# Patient Record
Sex: Female | Born: 1990 | Race: White | Hispanic: No | Marital: Single | Smoking: Never smoker
Health system: Southern US, Community
[De-identification: ages and names within clinical notes are randomized; demographics above are authoritative.]

## PROBLEM LIST (undated history)

## (undated) HISTORY — PX: WISDOM TOOTH EXTRACTION: SHX21

## (undated) HISTORY — PX: JOINT REPLACEMENT: SHX530

---

## 2017-12-29 DIAGNOSIS — Z880 Allergy status to penicillin: Secondary | ICD-10-CM | POA: Insufficient documentation

## 2017-12-29 DIAGNOSIS — Z9660 Presence of unspecified orthopedic joint implant: Secondary | ICD-10-CM | POA: Insufficient documentation

## 2017-12-29 DIAGNOSIS — Z8249 Family history of ischemic heart disease and other diseases of the circulatory system: Secondary | ICD-10-CM | POA: Insufficient documentation

## 2017-12-29 DIAGNOSIS — K3580 Unspecified acute appendicitis: Principal | ICD-10-CM | POA: Insufficient documentation

## 2017-12-29 DIAGNOSIS — R109 Unspecified abdominal pain: Secondary | ICD-10-CM | POA: Insufficient documentation

## 2017-12-29 LAB — COMPREHENSIVE METABOLIC PANEL
ALBUMIN: 4 g/dL (ref 3.5–5.0)
ALT: 18 U/L (ref 14–54)
ANION GAP: 8 (ref 5–15)
AST: 22 U/L (ref 15–41)
Alkaline Phosphatase: 48 U/L (ref 38–126)
BUN: 12 mg/dL (ref 6–20)
CO2: 22 mmol/L (ref 22–32)
Calcium: 8.6 mg/dL — ABNORMAL LOW (ref 8.9–10.3)
Chloride: 106 mmol/L (ref 101–111)
Creatinine, Ser: 0.98 mg/dL (ref 0.44–1.00)
GFR calc Af Amer: >60 mL/min (ref >60)
GFR calc non Af Amer: >60 mL/min (ref >60)
GLUCOSE: 126 mg/dL — AB (ref 65–99)
POTASSIUM: 3.4 mmol/L — AB (ref 3.5–5.1)
SODIUM: 136 mmol/L (ref 135–145)
Total Bilirubin: 0.8 mg/dL (ref 0.3–1.2)
Total Protein: 6.7 g/dL (ref 6.5–8.1)

## 2017-12-29 LAB — URINALYSIS, COMPLETE (UACMP) WITH MICROSCOPIC
BACTERIA UA: NONE SEEN
Bilirubin Urine: NEGATIVE
Glucose, UA: NEGATIVE mg/dL
HGB URINE DIPSTICK: NEGATIVE
KETONES UR: 5 mg/dL — AB
Leukocytes, UA: NEGATIVE
NITRITE: NEGATIVE
PROTEIN: NEGATIVE mg/dL
Specific Gravity, Urine: 1.016 (ref 1.005–1.030)
pH: 7 (ref 5.0–8.0)

## 2017-12-29 LAB — CBC
HEMATOCRIT: 36.9 % (ref 35.0–47.0)
HEMOGLOBIN: 12.5 g/dL (ref 12.0–16.0)
MCH: 31.5 pg (ref 26.0–34.0)
MCHC: 33.9 g/dL (ref 32.0–36.0)
MCV: 92.8 fL (ref 80.0–100.0)
Platelets: 251 10*3/uL (ref 150–440)
RBC: 3.97 MIL/uL (ref 3.80–5.20)
RDW: 12.4 % (ref 11.5–14.5)
WBC: 12.9 10*3/uL — ABNORMAL HIGH (ref 3.6–11.0)

## 2017-12-29 LAB — POCT PREGNANCY, URINE: PREG TEST UR: NEGATIVE

## 2017-12-29 LAB — LIPASE, BLOOD: LIPASE: 24 U/L (ref 11–51)

## 2017-12-29 NOTE — ED Triage Notes (Signed)
Patient c/o lower right abdominal pain described as twisting. Patient denies N/V, changes to urination, hx of ovarian cysts.

## 2017-12-29 NOTE — ED Notes (Signed)
Patient ambulatory to stat desk without difficulty or distress noted.  Patient reports having right lower quad abdominal pain.

## 2017-12-30 ENCOUNTER — Encounter
Admission: EM | Disposition: A | Discharge status: Home / Self Care | Payer: Self-pay | Source: Home / Self Care | Attending: Emergency Medicine

## 2017-12-30 ENCOUNTER — Emergency Department: Payer: Self-pay

## 2017-12-30 ENCOUNTER — Observation Stay: Payer: Self-pay | Admitting: Registered Nurse

## 2017-12-30 ENCOUNTER — Observation Stay
Admission: EM | Admit: 2017-12-30 | Discharge: 2017-12-31 | Disposition: A | Discharge status: Home / Self Care | Payer: Self-pay | Attending: General Surgery | Admitting: General Surgery

## 2017-12-30 ENCOUNTER — Other Ambulatory Visit: Payer: Self-pay

## 2017-12-30 DIAGNOSIS — K3580 Unspecified acute appendicitis: Principal | ICD-10-CM

## 2017-12-30 DIAGNOSIS — R1031 Right lower quadrant pain: Secondary | ICD-10-CM

## 2017-12-30 DIAGNOSIS — R109 Unspecified abdominal pain: Secondary | ICD-10-CM | POA: Diagnosis present

## 2017-12-30 HISTORY — PX: LAPAROSCOPIC APPENDECTOMY: SHX408

## 2017-12-30 LAB — BASIC METABOLIC PANEL
Anion gap: 10 (ref 5–15)
BUN: 8 mg/dL (ref 6–20)
CO2: 21 mmol/L — AB (ref 22–32)
CREATININE: 0.72 mg/dL (ref 0.44–1.00)
Calcium: 8.1 mg/dL — ABNORMAL LOW (ref 8.9–10.3)
Chloride: 106 mmol/L (ref 101–111)
GFR calc Af Amer: >60 mL/min (ref >60)
GFR calc non Af Amer: >60 mL/min (ref >60)
Glucose, Bld: 167 mg/dL — ABNORMAL HIGH (ref 65–99)
Potassium: 3.6 mmol/L (ref 3.5–5.1)
SODIUM: 137 mmol/L (ref 135–145)

## 2017-12-30 LAB — CBC
HCT: 34.8 % — ABNORMAL LOW (ref 35.0–47.0)
Hemoglobin: 11.8 g/dL — ABNORMAL LOW (ref 12.0–16.0)
MCH: 31.1 pg (ref 26.0–34.0)
MCHC: 33.8 g/dL (ref 32.0–36.0)
MCV: 91.9 fL (ref 80.0–100.0)
PLATELETS: 190 10*3/uL (ref 150–440)
RBC: 3.79 MIL/uL — ABNORMAL LOW (ref 3.80–5.20)
RDW: 12.4 % (ref 11.5–14.5)
WBC: 12.3 10*3/uL — ABNORMAL HIGH (ref 3.6–11.0)

## 2017-12-30 LAB — TYPE AND SCREEN
ABO/RH(D): A POS
ANTIBODY SCREEN: NEGATIVE

## 2017-12-30 SURGERY — APPENDECTOMY, LAPAROSCOPIC
Anesthesia: General

## 2017-12-30 MED ORDER — BUPIVACAINE-EPINEPHRINE (PF) 0.25% -1:200000 IJ SOLN
INTRAMUSCULAR | Status: DC | PRN
  Administered 2017-12-30: 10 mL

## 2017-12-30 MED ORDER — ONDANSETRON HCL 4 MG/2ML IJ SOLN
INTRAMUSCULAR | Status: AC
  Filled 2017-12-30: qty 2

## 2017-12-30 MED ORDER — ACETAMINOPHEN 10 MG/ML IV SOLN
1000.0000 mg | Freq: Four times a day (QID) | INTRAVENOUS | Status: AC | PRN
  Administered 2017-12-30 (×2): 1000 mg via INTRAVENOUS
  Filled 2017-12-30 (×3): qty 100

## 2017-12-30 MED ORDER — SUGAMMADEX SODIUM 200 MG/2ML IV SOLN
INTRAVENOUS | Status: AC
  Filled 2017-12-30: qty 2

## 2017-12-30 MED ORDER — DEXTROSE 5 % IV SOLN
2.0000 g | Freq: Once | INTRAVENOUS | Status: AC
  Administered 2017-12-30: 2 g via INTRAVENOUS
  Filled 2017-12-30 (×2): qty 2

## 2017-12-30 MED ORDER — ONDANSETRON HCL 4 MG/2ML IJ SOLN
4.0000 mg | Freq: Once | INTRAMUSCULAR | Status: AC
  Administered 2017-12-30: 4 mg via INTRAVENOUS
  Filled 2017-12-30: qty 2

## 2017-12-30 MED ORDER — PROMETHAZINE HCL 25 MG/ML IJ SOLN: 6.2500 mg | INTRAMUSCULAR | Status: DC | PRN

## 2017-12-30 MED ORDER — KETOROLAC TROMETHAMINE 30 MG/ML IJ SOLN
INTRAMUSCULAR | Status: AC
Start: 2017-12-30 — End: ?
  Filled 2017-12-30: qty 1

## 2017-12-30 MED ORDER — MORPHINE SULFATE (PF) 2 MG/ML IV SOLN
2.0000 mg | INTRAVENOUS | Status: DC | PRN
  Administered 2017-12-30 (×3): 2 mg via INTRAVENOUS
  Filled 2017-12-30 (×3): qty 1

## 2017-12-30 MED ORDER — IOPAMIDOL (ISOVUE-300) INJECTION 61%
100.0000 mL | Freq: Once | INTRAVENOUS | Status: AC | PRN
  Administered 2017-12-30: 100 mL via INTRAVENOUS

## 2017-12-30 MED ORDER — LIDOCAINE HCL (PF) 2 % IJ SOLN
INTRAMUSCULAR | Status: AC
  Filled 2017-12-30: qty 10

## 2017-12-30 MED ORDER — MIDAZOLAM HCL 2 MG/2ML IJ SOLN
INTRAMUSCULAR | Status: DC | PRN
  Administered 2017-12-30: 2 mg via INTRAVENOUS

## 2017-12-30 MED ORDER — MORPHINE SULFATE (PF) 4 MG/ML IV SOLN
4.0000 mg | Freq: Once | INTRAVENOUS | Status: AC
  Administered 2017-12-30: 4 mg via INTRAVENOUS
  Filled 2017-12-30: qty 1

## 2017-12-30 MED ORDER — LIDOCAINE HCL 1 % IJ SOLN
INTRAMUSCULAR | Status: DC | PRN
  Administered 2017-12-30: 10 mL

## 2017-12-30 MED ORDER — HYDROCODONE-ACETAMINOPHEN 5-325 MG PO TABS
1.0000 | ORAL_TABLET | Freq: Four times a day (QID) | ORAL | Status: DC | PRN
  Administered 2017-12-30 – 2017-12-31 (×2): 1 via ORAL
  Filled 2017-12-30: qty 2

## 2017-12-30 MED ORDER — SCOPOLAMINE 1 MG/3DAYS TD PT72
MEDICATED_PATCH | TRANSDERMAL | Status: AC
  Filled 2017-12-30: qty 1

## 2017-12-30 MED ORDER — ROCURONIUM BROMIDE 100 MG/10ML IV SOLN
INTRAVENOUS | Status: DC | PRN
  Administered 2017-12-30: 10 mg via INTRAVENOUS
  Administered 2017-12-30: 30 mg via INTRAVENOUS

## 2017-12-30 MED ORDER — DEXAMETHASONE SODIUM PHOSPHATE 10 MG/ML IJ SOLN
INTRAMUSCULAR | Status: DC | PRN
  Administered 2017-12-30: 5 mg via INTRAVENOUS

## 2017-12-30 MED ORDER — BUPIVACAINE-EPINEPHRINE (PF) 0.25% -1:200000 IJ SOLN
INTRAMUSCULAR | Status: AC
  Filled 2017-12-30: qty 30

## 2017-12-30 MED ORDER — DIPHENHYDRAMINE HCL 50 MG/ML IJ SOLN
25.0000 mg | Freq: Four times a day (QID) | INTRAMUSCULAR | Status: DC | PRN
Start: 2017-12-30 — End: 2017-12-31

## 2017-12-30 MED ORDER — FENTANYL CITRATE (PF) 100 MCG/2ML IJ SOLN
INTRAMUSCULAR | Status: AC
  Filled 2017-12-30: qty 2

## 2017-12-30 MED ORDER — CIPROFLOXACIN IN D5W 400 MG/200ML IV SOLN
400.0000 mg | Freq: Two times a day (BID) | INTRAVENOUS | Status: DC
  Administered 2017-12-30 – 2017-12-31 (×3): 400 mg via INTRAVENOUS
  Filled 2017-12-30 (×4): qty 200

## 2017-12-30 MED ORDER — MIDAZOLAM HCL 2 MG/2ML IJ SOLN
INTRAMUSCULAR | Status: AC
  Filled 2017-12-30: qty 2

## 2017-12-30 MED ORDER — LIDOCAINE HCL (PF) 1 % IJ SOLN
INTRAMUSCULAR | Status: AC
  Filled 2017-12-30: qty 30

## 2017-12-30 MED ORDER — SODIUM CHLORIDE 0.9 % IV BOLUS (SEPSIS)
1000.0000 mL | Freq: Once | INTRAVENOUS | Status: AC
  Administered 2017-12-30: 1000 mL via INTRAVENOUS

## 2017-12-30 MED ORDER — METRONIDAZOLE IN NACL 5-0.79 MG/ML-% IV SOLN
500.0000 mg | Freq: Once | INTRAVENOUS | Status: AC
  Administered 2017-12-30: 500 mg via INTRAVENOUS
  Filled 2017-12-30: qty 100

## 2017-12-30 MED ORDER — PROPOFOL 10 MG/ML IV BOLUS
INTRAVENOUS | Status: AC
  Filled 2017-12-30: qty 20

## 2017-12-30 MED ORDER — ONDANSETRON HCL 4 MG/2ML IJ SOLN
4.0000 mg | Freq: Four times a day (QID) | INTRAMUSCULAR | Status: DC | PRN
  Administered 2017-12-30 (×2): 4 mg via INTRAVENOUS
  Filled 2017-12-30 (×2): qty 2

## 2017-12-30 MED ORDER — HYDRALAZINE HCL 20 MG/ML IJ SOLN: 10.0000 mg | INTRAMUSCULAR | Status: DC | PRN

## 2017-12-30 MED ORDER — LACTATED RINGERS IV SOLN
INTRAVENOUS | Status: DC | PRN
  Administered 2017-12-30: 15:00:00 via INTRAVENOUS

## 2017-12-30 MED ORDER — PROPOFOL 10 MG/ML IV BOLUS
INTRAVENOUS | Status: DC | PRN
  Administered 2017-12-30: 150 mg via INTRAVENOUS

## 2017-12-30 MED ORDER — SUGAMMADEX SODIUM 200 MG/2ML IV SOLN
INTRAVENOUS | Status: DC | PRN
  Administered 2017-12-30: 150 mg via INTRAVENOUS

## 2017-12-30 MED ORDER — METRONIDAZOLE IN NACL 5-0.79 MG/ML-% IV SOLN
500.0000 mg | Freq: Three times a day (TID) | INTRAVENOUS | Status: DC
  Administered 2017-12-30 – 2017-12-31 (×4): 500 mg via INTRAVENOUS
  Filled 2017-12-30 (×5): qty 100

## 2017-12-30 MED ORDER — DEXAMETHASONE SODIUM PHOSPHATE 10 MG/ML IJ SOLN
INTRAMUSCULAR | Status: AC
  Filled 2017-12-30: qty 1

## 2017-12-30 MED ORDER — FENTANYL CITRATE (PF) 100 MCG/2ML IJ SOLN: 25.0000 ug | INTRAMUSCULAR | Status: DC | PRN

## 2017-12-30 MED ORDER — FENTANYL CITRATE (PF) 100 MCG/2ML IJ SOLN
INTRAMUSCULAR | Status: DC | PRN
  Administered 2017-12-30 (×2): 25 ug via INTRAVENOUS
  Administered 2017-12-30 (×3): 50 ug via INTRAVENOUS

## 2017-12-30 MED ORDER — KETOROLAC TROMETHAMINE 30 MG/ML IJ SOLN
INTRAMUSCULAR | Status: DC | PRN
  Administered 2017-12-30: 30 mg via INTRAVENOUS

## 2017-12-30 MED ORDER — DEXTROSE IN LACTATED RINGERS 5 % IV SOLN
INTRAVENOUS | Status: DC
  Administered 2017-12-30 – 2017-12-31 (×3): via INTRAVENOUS

## 2017-12-30 MED ORDER — DIPHENHYDRAMINE HCL 25 MG PO CAPS: 25.0000 mg | ORAL_CAPSULE | Freq: Four times a day (QID) | ORAL | Status: DC | PRN

## 2017-12-30 MED ORDER — ONDANSETRON 4 MG PO TBDP: 4.0000 mg | ORAL_TABLET | Freq: Four times a day (QID) | ORAL | Status: DC | PRN

## 2017-12-30 MED ORDER — LIDOCAINE HCL (CARDIAC) 20 MG/ML IV SOLN
INTRAVENOUS | Status: DC | PRN
  Administered 2017-12-30: 60 mg via INTRAVENOUS

## 2017-12-30 MED ORDER — ROCURONIUM BROMIDE 50 MG/5ML IV SOLN
INTRAVENOUS | Status: AC
  Filled 2017-12-30: qty 1

## 2017-12-30 MED ORDER — ONDANSETRON HCL 4 MG/2ML IJ SOLN
INTRAMUSCULAR | Status: DC | PRN
  Administered 2017-12-30: 4 mg via INTRAVENOUS

## 2017-12-30 SURGICAL SUPPLY — 44 items
ADHESIVE MASTISOL STRL (MISCELLANEOUS) ×3 IMPLANT
APPLIER CLIP 5 13 M/L LIGAMAX5 (MISCELLANEOUS)
BLADE SURG SZ11 CARB STEEL (BLADE) ×3 IMPLANT
BULB RESERV EVAC DRAIN JP 100C (MISCELLANEOUS) IMPLANT
CANISTER SUCT 1200ML W/VALVE (MISCELLANEOUS) ×3 IMPLANT
CHLORAPREP W/TINT 26ML (MISCELLANEOUS) ×3 IMPLANT
CLIP APPLIE 5 13 M/L LIGAMAX5 (MISCELLANEOUS) IMPLANT
CLOSURE WOUND 1/2 X4 (GAUZE/BANDAGES/DRESSINGS) ×1
CUTTER FLEX LINEAR 45M (STAPLE) ×3 IMPLANT
DRAIN CHANNEL JP 19F (MISCELLANEOUS) IMPLANT
DRSG TEGADERM 2-3/8X2-3/4 SM (GAUZE/BANDAGES/DRESSINGS) ×9 IMPLANT
DRSG TELFA 4X3 1S NADH ST (GAUZE/BANDAGES/DRESSINGS) ×3 IMPLANT
ELECT REM PT RETURN 9FT ADLT (ELECTROSURGICAL) ×3
ELECTRODE REM PT RTRN 9FT ADLT (ELECTROSURGICAL) ×1 IMPLANT
GLOVE BIO SURGEON STRL SZ7.5 (GLOVE) ×9 IMPLANT
GLOVE INDICATOR 8.0 STRL GRN (GLOVE) ×3 IMPLANT
GOWN STRL REUS W/ TWL LRG LVL3 (GOWN DISPOSABLE) ×2 IMPLANT
GOWN STRL REUS W/TWL LRG LVL3 (GOWN DISPOSABLE) ×4
IRRIGATION STRYKERFLOW (MISCELLANEOUS) ×1 IMPLANT
IRRIGATOR STRYKERFLOW (MISCELLANEOUS) ×3
IV NS 1000ML (IV SOLUTION) ×2
IV NS 1000ML BAXH (IV SOLUTION) ×1 IMPLANT
KIT TURNOVER KIT A (KITS) ×3 IMPLANT
LABEL OR SOLS (LABEL) ×3 IMPLANT
NEEDLE HYPO 25X1 1.5 SAFETY (NEEDLE) ×3 IMPLANT
NEEDLE VERESS 14GA 120MM (NEEDLE) ×3 IMPLANT
NS IRRIG 500ML POUR BTL (IV SOLUTION) ×3 IMPLANT
PACK LAP CHOLECYSTECTOMY (MISCELLANEOUS) ×3 IMPLANT
POUCH SPECIMEN RETRIEVAL 10MM (ENDOMECHANICALS) ×3 IMPLANT
RELOAD 45 VASCULAR/THIN (ENDOMECHANICALS) ×3 IMPLANT
RELOAD STAPLE TA45 3.5 REG BLU (ENDOMECHANICALS) ×3 IMPLANT
SCALPEL HARMONIC ACE (MISCELLANEOUS) ×3 IMPLANT
SLEEVE ENDOPATH XCEL 5M (ENDOMECHANICALS) ×3 IMPLANT
STRIP CLOSURE SKIN 1/2X4 (GAUZE/BANDAGES/DRESSINGS) ×2 IMPLANT
SUT MNCRL 4-0 (SUTURE) ×2
SUT MNCRL 4-0 27XMFL (SUTURE) ×1
SUT VIC AB 3-0 SH 27 (SUTURE) ×2
SUT VIC AB 3-0 SH 27X BRD (SUTURE) ×1 IMPLANT
SUT VICRYL 0 UR6 27IN ABS (SUTURE) IMPLANT
SUTURE MNCRL 4-0 27XMF (SUTURE) ×1 IMPLANT
TRAY FOLEY W/METER SILVER 16FR (SET/KITS/TRAYS/PACK) ×3 IMPLANT
TROCAR XCEL 12X100 BLDLESS (ENDOMECHANICALS) ×3 IMPLANT
TROCAR XCEL NON-BLD 5MMX100MML (ENDOMECHANICALS) ×3 IMPLANT
TUBING INSUFFLATION (TUBING) ×3 IMPLANT

## 2017-12-30 NOTE — ED Notes (Signed)
Pt transported to room 217. 

## 2017-12-30 NOTE — Anesthesia Post-op Follow-up Note (Signed)
Anesthesia QCDR form completed.        

## 2017-12-30 NOTE — ED Notes (Signed)
ED Provider at bedside. 

## 2017-12-30 NOTE — Transfer of Care (Signed)
Immediate Anesthesia Transfer of Care Note  Patient: Julie Odom  Procedure(s) Performed: APPENDECTOMY LAPAROSCOPIC (N/A )  Patient Location: PACU  Anesthesia Type:General  Level of Consciousness: awake, alert  and oriented  Airway & Oxygen Therapy: Patient Spontanous Breathing  Post-op Assessment: Report given to RN and Post -op Vital signs reviewed and stable  Post vital signs: Reviewed and stable  Last Vitals:  Vitals:   12/30/17 1238 12/30/17 1535  BP: 101/61 125/77  Pulse: 95 (!) 114  Resp: 18 18  Temp: 37 C 37.4 C  SpO2: 99% 100%    Last Pain:  Vitals:   12/30/17 1238  TempSrc: Oral  PainSc:       Patients Stated Pain Goal: 0 (12/30/17 1145)  Complications: No apparent anesthesia complications

## 2017-12-30 NOTE — Progress Notes (Signed)
15 minute call to floor. 

## 2017-12-30 NOTE — ED Notes (Signed)
admitting Provider at bedside. 

## 2017-12-30 NOTE — Anesthesia Preprocedure Evaluation (Signed)
Anesthesia Evaluation  Patient identified by MRN, date of birth, ID band Patient awake    Reviewed: Allergy & Precautions, H&P , NPO status , Patient's Chart, lab work & pertinent test results, reviewed documented beta blocker date and time   History of Anesthesia Complications Negative for: history of anesthetic complications  Airway Mallampati: II  TM Distance: >3 FB Neck ROM: full    Dental  (+) Teeth Intact, Dental Advidsory Given   Pulmonary neg pulmonary ROS,           Cardiovascular Exercise Tolerance: Good negative cardio ROS       Neuro/Psych negative neurological ROS  negative psych ROS   GI/Hepatic negative GI ROS, Neg liver ROS,   Endo/Other  negative endocrine ROS  Renal/GU negative Renal ROS  negative genitourinary   Musculoskeletal   Abdominal   Peds  Hematology negative hematology ROS (+)   Anesthesia Other Findings History reviewed. No pertinent past medical history.   Reproductive/Obstetrics negative OB ROS                             Anesthesia Physical Anesthesia Plan  ASA: I  Anesthesia Plan: General   Post-op Pain Management:    Induction: Intravenous  PONV Risk Score and Plan: 3 and Ondansetron and Dexamethasone  Airway Management Planned: Oral ETT  Additional Equipment:   Intra-op Plan:   Post-operative Plan: Extubation in OR  Informed Consent: I have reviewed the patients History and Physical, chart, labs and discussed the procedure including the risks, benefits and alternatives for the proposed anesthesia with the patient or authorized representative who has indicated his/her understanding and acceptance.   Dental Advisory Given  Plan Discussed with: Anesthesiologist, CRNA and Surgeon  Anesthesia Plan Comments:         Anesthesia Quick Evaluation

## 2017-12-30 NOTE — Progress Notes (Signed)
CC: Abdominal pain Subjective: Patient admitted overnight with abdominal pain and questionable appendicitis.  Patient reports she is not any better this morning.  Continues to feel rundown.  Objective: Vital signs in last 24 hours: Temp:  [98.4 F (36.9 C)-99 F (37.2 C)] 98.4 F (36.9 C) (02/02 0427) Pulse Rate:  [88-115] 105 (02/02 0427) Resp:  [16-20] 20 (02/02 0427) BP: (101-132)/(66-81) 101/66 (02/02 0427) SpO2:  [96 %-100 %] 96 % (02/02 0427) Weight:  [60.3 kg (133 lb)-61.2 kg (135 lb)] 61.2 kg (135 lb) (02/01 1950) Last BM Date: 12/29/17  Intake/Output from previous day: No intake/output data recorded. Intake/Output this shift: No intake/output data recorded.  Physical exam:  General: No acute distress Chest: Clear to auscultation  heart: Tachycardic Abdomen: Soft, tender to palpation in the right lower quadrant  Lab Results: CBC  Recent Labs    12/29/17 1952 12/30/17 0546  WBC 12.9* 12.3*  HGB 12.5 11.8*  HCT 36.9 34.8*  PLT 251 190   BMET Recent Labs    12/29/17 1952 12/30/17 0546  NA 136 137  K 3.4* 3.6  CL 106 106  CO2 22 21*  GLUCOSE 126* 167*  BUN 12 8  CREATININE 0.98 0.72  CALCIUM 8.6* 8.1*   PT/INR No results for input(s): LABPROT, INR in the last 72 hours. ABG No results for input(s): PHART, HCO3 in the last 72 hours.  Invalid input(s): PCO2, PO2  Studies/Results: Ct Abdomen Pelvis W Contrast  Result Date: 12/30/2017 CLINICAL DATA:  Right lower quadrant abdominal pain. EXAM: CT ABDOMEN AND PELVIS WITH CONTRAST TECHNIQUE: Multidetector CT imaging of the abdomen and pelvis was performed using the standard protocol following bolus administration of intravenous contrast. CONTRAST:  ISOVUE-300 IOPAMIDOL (ISOVUE-300) INJECTION 61% COMPARISON:  None. FINDINGS: Lower chest: No acute abnormality. Hepatobiliary: No focal liver abnormality is seen. No gallstones, gallbladder wall thickening, or biliary dilatation. Pancreas: Unremarkable. No  pancreatic ductal dilatation or surrounding inflammatory changes. Spleen: Normal in size without focal abnormality. Adrenals/Urinary Tract: Adrenal glands are unremarkable. Kidneys are normal, without renal calculi, focal lesion, or hydronephrosis. Bladder is unremarkable. Stomach/Bowel: The stomach is unremarkable. There is no evidence of bowel obstruction. The appendix is enlarged and fluid filled with probable appendicolith, concerning for appendicitis. Appendix: Location: Right lower quadrant. Diameter: 15 mm. Appendicolith: Yes. Mucosal hyper-enhancement: Yes. Extraluminal gas: No. Periappendiceal collection: No. Vascular/Lymphatic: No significant vascular findings are present. No enlarged abdominal or pelvic lymph nodes. Reproductive: Uterus and bilateral adnexa are unremarkable. Other: Mild amount of free fluid is noted posteriorly in the pelvis which may be physiologic or potentially related to possible appendicitis. Musculoskeletal: No acute or significant osseous findings. IMPRESSION: Findings consistent with acute appendicitis. No definite abscess formation is seen. Electronically Signed   By: Lupita Raider, M.D.   On: 12/30/2017 02:53    Anti-infectives: Anti-infectives (From admission, onward)   Start     Dose/Rate Route Frequency Ordered Stop   12/30/17 1130  metroNIDAZOLE (FLAGYL) IVPB 500 mg     500 mg 100 mL/hr over 60 Minutes Intravenous Every 8 hours 12/30/17 0431     12/30/17 0431  ciprofloxacin (CIPRO) IVPB 400 mg     400 mg 200 mL/hr over 60 Minutes Intravenous Every 12 hours 12/30/17 0431     12/30/17 0315  cefTRIAXone (ROCEPHIN) 2 g in dextrose 5 % 50 mL IVPB     2 g 100 mL/hr over 30 Minutes Intravenous  Once 12/30/17 0308 12/30/17 0818   12/30/17 0315  metroNIDAZOLE (FLAGYL) IVPB  500 mg     500 mg 100 mL/hr over 60 Minutes Intravenous  Once 12/30/17 0308 12/30/17 0424      Assessment/Plan:  27 year old female with abdominal pain.  Concerning for appendicitis.   Discussed with the patient that with her current findings that I would recommend going to the operating room for laparoscopic appendectomy.  The procedure was described in detail to include the risk, benefits, alternatives.  Patient voiced understanding and desires to proceed.  Discussed that should she miraculously improved between now and this afternoon that we might cancel her operation but for now would proceed with a laparoscopic appendectomy.  Patient voiced understanding and agrees with this plan.  Welma Mccombs T. Tonita CongWoodham, MD, [facility] HospitalFACS General Surgeon [facility]  Day [facility] [phone](7a-7p) [phone] Night [facility] [phone](7p-7a) [phone] 12/30/2017

## 2017-12-30 NOTE — ED Provider Notes (Signed)
[facility] Emergency Department Provider Note  ____________________________________________   First MD Initiated Contact with Patient 12/30/17 0154     (approximate)  I have reviewed the triage vital signs and the nursing notes.   HISTORY  Chief Complaint Abdominal Pain   HPI Julie Odom is a 27 y.o. female who self presents to the emergency department with 1 day of abdominal pain.  The pain began insidiously although has been slowly progressive and is now moderate to severe.  It began as periumbilical pain but is progressed on to her right lower quadrant.  Is worse with movement coughing or sneezing.  Somewhat improved with rest.  Her last menstrual period was 1 week ago.  It is associated with some nausea but no vomiting.  No diarrhea.  She has no history of abdominal surgeries.  History reviewed. No pertinent past medical history.  There are no active problems to display for this patient.   Past Surgical History:  Procedure Laterality Date  . JOINT REPLACEMENT    . WISDOM TOOTH EXTRACTION      Prior to Admission medications   Medication Sig Start Date End Date Taking? Authorizing Provider  Norgestimate-Ethinyl Estradiol Triphasic (TRI-LINYAH) 0.18/0.215/0.25 MG-35 MCG tablet Take 1 tablet by mouth daily. 09/26/16  Yes [provider]    Allergies Penicillins  No family history on file.  Social History Social History   Tobacco Use  . Smoking status: Never Smoker  . Smokeless tobacco: Never Used  Substance Use Topics  . Alcohol use: Yes  . Drug use: Not on file    Review of Systems Constitutional: No fever/chills Eyes: No visual changes. ENT: No sore throat. Cardiovascular: Denies chest pain. Respiratory: Denies shortness of breath. Gastrointestinal: Positive for abdominal pain.  Positive for nausea, no vomiting.  No diarrhea.  No constipation. Genitourinary: Negative for dysuria. Musculoskeletal: Negative for back  pain. Skin: Negative for rash. Neurological: Negative for headaches, focal weakness or numbness.   ____________________________________________   PHYSICAL EXAM:  VITAL SIGNS: ED Triage Vitals  Enc Vitals Group     BP 12/29/17 1945 132/80     Pulse Rate 12/29/17 1945 88     Resp 12/29/17 1945 18     Temp 12/29/17 1945 98.9 F (37.2 C)     Temp Source 12/29/17 1945 Oral     SpO2 12/29/17 1945 98 %     Weight 12/29/17 1947 133 lb (60.3 kg)     Height 12/29/17 1947 5\' 4"  (1.626 m)     Head Circumference --      Peak Flow --      Pain Score 12/29/17 1950 7     Pain Loc --      Pain Edu? --      Excl. in GC? --     Constitutional: Alert and oriented x4 appears quite uncomfortable grimacing in pain Eyes: PERRL EOMI. Head: Atraumatic. Nose: No congestion/rhinnorhea. Mouth/Throat: No trismus Neck: No stridor.   Cardiovascular: Tachycardic rate, regular rhythm. Grossly normal heart sounds.  Good peripheral circulation. Respiratory: Normal respiratory effort.  No retractions. Lungs CTAB and moving good air Gastrointestinal: Quite tender over McBurney's point and tender in the left lower quadrant with positive Rovsing's positive rebound positive guarding and positive local peritonitis Musculoskeletal: No lower extremity edema   Neurologic:  Normal speech and language. No gross focal neurologic deficits are appreciated. Skin:  Skin is warm, dry and intact. No rash noted. Psychiatric: Mood and affect are normal. Speech and behavior are  normal.    ____________________________________________   DIFFERENTIAL includes but not limited to  Appendicitis, diverticulitis, ovarian torsion, ovarian cyst, nephrolithiasis, pyelonephritis ____________________________________________   LABS (all labs ordered are listed, but only abnormal results are displayed)  Labs Reviewed  COMPREHENSIVE METABOLIC PANEL - Abnormal; Notable for the following components:      Result Value   Potassium  3.4 (*)    Glucose, Bld 126 (*)    Calcium 8.6 (*)    All other components within normal limits  CBC - Abnormal; Notable for the following components:   WBC 12.9 (*)    All other components within normal limits  URINALYSIS, COMPLETE (UACMP) WITH MICROSCOPIC - Abnormal; Notable for the following components:   Color, Urine YELLOW (*)    APPearance CLEAR (*)    Ketones, ur 5 (*)    Squamous Epithelial / LPF 0-5 (*)    All other components within normal limits  LIPASE, BLOOD  POC URINE PREG, ED  POCT PREGNANCY, URINE  TYPE AND SCREEN    Lab work reviewed by me shows elevated white count consistent with infection __________________________________________  EKG   ____________________________________________  RADIOLOGY  CT abdomen pelvis reviewed by me consistent with acute nonperforated appendicitis ____________________________________________   PROCEDURES  Procedure(s) performed: no  Procedures  Critical Care performed: no  Observation: no ____________________________________________   INITIAL IMPRESSION / ASSESSMENT AND PLAN / ED COURSE  Pertinent labs & imaging results that were available during my care of the patient were reviewed by me and considered in my medical decision making (see chart for details).  The patient arrives tachycardic, uncomfortable appearing, with significant right lower quadrant tenderness and local peritonitis.  High clinical suspicion for appendicitis.  Morphine Zofran fluids and n.p.o. and CT scan with IV contrast are pending.    ----------------------------------------- 3:10 AM on 12/30/2017 -----------------------------------------  I spoke with on-call general surgeon Dr. Tonita Cong who will kindly come evaluate the patient.  The patient's pain is improved and she declines further pain medication at this time.  Her reported history of penicillin allergy is unknown ceftriaxone and  Flagyl.  ____________________________________________   FINAL CLINICAL IMPRESSION(S) / ED DIAGNOSES  Final diagnoses:  Acute appendicitis, unspecified acute appendicitis type      NEW MEDICATIONS STARTED DURING THIS VISIT:  New Prescriptions   No medications on file     Note:  This document was prepared using Dragon voice recognition software and may include unintentional dictation errors.     Merrily Brittle, MD 12/30/17 [phone]

## 2017-12-30 NOTE — Progress Notes (Signed)
Po ginger ale given to patient.

## 2017-12-30 NOTE — Brief Op Note (Signed)
12/30/2017  3:25 PM  PATIENT:  Ernestine McmurrayMary Chojnowski  27 y.o. female  PRE-OPERATIVE DIAGNOSIS:  appendicitis  POST-OPERATIVE DIAGNOSIS:  same  PROCEDURE:  Procedure(s): APPENDECTOMY LAPAROSCOPIC (N/A)  SURGEON:  Surgeon(s) and Role:    * Ricarda FrameWoodham, Levante Simones, MD - Primary  PHYSICIAN ASSISTANT:   ASSISTANTS: none   ANESTHESIA:   general  EBL:  10 mL   BLOOD ADMINISTERED:none  DRAINS: none   LOCAL MEDICATIONS USED:  MARCAINE   , XYLOCAINE  and Amount: 20 ml  SPECIMEN:  Source of Specimen:  appendix  DISPOSITION OF SPECIMEN:  PATHOLOGY  COUNTS:  YES  TOURNIQUET:  * No tourniquets in log *  DICTATION: .Dragon Dictation  PLAN OF CARE: Admit for overnight observation  PATIENT DISPOSITION:  PACU - hemodynamically stable.   Delay start of Pharmacological VTE agent (>24hrs) due to surgical blood loss or risk of bleeding: no

## 2017-12-30 NOTE — Anesthesia Procedure Notes (Signed)
Procedure Name: Intubation Date/Time: 12/30/2017 2:28 PM Performed by: Hedda Slade, CRNA Pre-anesthesia Checklist: Patient identified, Patient being monitored, Timeout performed, Emergency Drugs available and Suction available Patient Re-evaluated:Patient Re-evaluated prior to induction Oxygen Delivery Method: Circle system utilized Preoxygenation: Pre-oxygenation with 100% oxygen Induction Type: IV induction Ventilation: Mask ventilation without difficulty Laryngoscope Size: Mac and 3 Grade View: Grade I Tube type: Oral Tube size: 7.0 mm Number of attempts: 1 Airway Equipment and Method: Stylet Placement Confirmation: ETT inserted through vocal cords under direct vision,  positive ETCO2 and breath sounds checked- equal and bilateral Secured at: 21 cm Tube secured with: Tape Dental Injury: Teeth and Oropharynx as per pre-operative assessment

## 2017-12-30 NOTE — H&P (Signed)
Patient ID: Julie Odom, female   DOB: Jan 25, 1991, 27 y.o.   MRN: [id number]  CC: Abdominal pain  HPI Julie McmurrayMary Fare is a 27 y.o. female presents emergency department with a 1 day history of abdominal pain.  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  The pain gradually localized to her right lower quadrant and worsened throughout the day prompting her to be seen in the emergency department.  Patient reports it is a sharp stabbing pain worsening with palpation or movement.  She is never had pain like this before.  She has had some nausea but denies any vomiting.  She denies any fevers, chills, chest pain, shortness of breath, diarrhea, constipation.  She was in her usual state of excellent health prior to this occurring.  HPI  History reviewed. No pertinent past medical history.  Patient has had an ACL repair but no abdominal surgeries.  Past Surgical History:  Procedure Laterality Date  . JOINT REPLACEMENT    . WISDOM TOOTH EXTRACTION      Family history: Patient with a father with cardiac disease but no known history of diabetes or cancers within the family.  Social History Social History   Tobacco Use  . Smoking status: Never Smoker  . Smokeless tobacco: Never Used  Substance Use Topics  . Alcohol use: Yes  . Drug use: Not on file    Allergies  Allergen Reactions  . Penicillins     Current Facility-Administered Medications  Medication Dose Route Frequency Provider Last Rate Last Dose  . cefTRIAXone (ROCEPHIN) 2 g in dextrose 5 % 50 mL IVPB  2 g Intravenous Once Merrily Brittleifenbark, Neil, MD       And  . metroNIDAZOLE (FLAGYL) IVPB 500 mg  500 mg Intravenous Once Merrily Brittleifenbark, Neil, MD 100 mL/hr at 12/30/17 0324 500 mg at 12/30/17 [id number]   Current Outpatient Medications  Medication Sig Dispense Refill  . Norgestimate-Ethinyl Estradiol Triphasic (TRI-LINYAH) 0.18/0.215/0.25 MG-35 MCG tablet Take 1 tablet by mouth  daily.       Review of Systems A multi-point review of systems was asked and was negative except for the findings documented in the HPI  Physical Exam Blood pressure 125/81, pulse (!) 107, temperature 99 F (37.2 C), temperature source Oral, resp. rate 16, height 5\' 4"  (1.626 m), weight 61.2 kg (135 lb), last menstrual period 12/22/2017, SpO2 100 %. CONSTITUTIONAL: Resting in bed no acute distress. EYES: Pupils are equal, round, and reactive to light, Sclera are non-icteric. EARS, NOSE, MOUTH AND THROAT: The oropharynx is clear. The oral mucosa is pink and moist. Hearing is intact to voice. LYMPH NODES:  Lymph nodes in the neck are normal. RESPIRATORY:  Lungs are clear. There is normal respiratory effort, with equal breath sounds bilaterally, and without pathologic use of accessory muscles. CARDIOVASCULAR: Heart is regular without murmurs, gallops, or rubs. GI: The abdomen is petite, soft, tender to palpation in the right lower quadrant near McBurney's point but without rebound or guarding, and nondistended. There are no palpable masses. There is no hepatosplenomegaly. There are normal bowel sounds in all quadrants. GU: Rectal deferred.   MUSCULOSKELETAL: Normal muscle strength and tone. No cyanosis or edema.   SKIN: Turgor is good and there are no pathologic skin lesions or ulcers. NEUROLOGIC: Motor and sensation is grossly normal. Cranial nerves are grossly intact. PSYCH:  Oriented to person, place and time. Affect is normal.  Data Reviewed Images and labs  reviewed.  Labs are significant for leukocytosis of 12.9 but the electrolytes are otherwise within normal limits.  CT scan of the abdomen does show evidence of inflammation in the right lower quadrant and the radiologist interprets it as appendicitis.  Difficult to differentiate between the appendix and small bowel in the right lower quadrant. I have personally reviewed the patient's imaging, laboratory findings and medical records.     Assessment    Abdominal pain    Plan    27 year old female with right lower quadrant abdominal pain.  With her history, physical, imaging findings discussed the probability of being appendicitis with the patient.  Discussed the treatment options of surgery for an appendectomy versus IV antibiotic therapy for appendicitis versus serial exams in case this is a diagnosis of appendicitis.  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  She elects observation with antibiotics and serial exams.  She understands should she fail to improve over the next 8-12 hours that she would again be offered surgery.  Should she worsen at any point despite therapy she will be offered surgery.  She understands the risks, benefits, alternatives.  Plan for observation, IV hydration, IV antibiotics, n.p.o.     Time spent with the patient was 50 minutes, with more than 50% of the time spent in face-to-face education, counseling and care coordination.     Ricarda Frame, MD FACS General Surgeon 12/30/2017, 3:49 AM

## 2017-12-30 NOTE — Op Note (Signed)
laparascopic appendectomy   Julie Odom Date of operation:  12/30/2017  Indications: The patient presented with a history of  abdominal pain. Workup has revealed findings consistent with acute appendicitis.  Pre-operative Diagnosis: Acute appendicitis without mention of peritonitis  Post-operative Diagnosis: Same  Surgeon: Leonette Mostharles T. Tonita CongWoodham, MD, FACS  Anesthesia: General with endotracheal tube  Procedure Details  The patient was seen again in the preop area. The options of surgery versus observation were reviewed with the patient and/or family. The risks of bleeding, infection, recurrence of symptoms, negative laparoscopy, potential for an open procedure, bowel injury, abscess or infection, were all reviewed as well. The patient was taken to Operating Room, identified as Julie Odom and the procedure verified as laparoscopic appendectomy. A Time Out was held and the above information confirmed.  The patient was placed in the supine position and general anesthesia was induced.  Antibiotic prophylaxis was administered and VT E prophylaxis was in place. A Foley catheter was placed by the nursing staff.   The abdomen was prepped and draped in a sterile fashion. An infraumbilical incision was made. A Veress needle was placed and pneumoperitoneum was obtained. A 5 mm trocar port was placed without difficulty and the abdominal cavity was explored.  Under direct vision a 5 mm suprapubic port was placed and a 12 mm left lateral port was placed all under direct vision.  The appendix was identified and found to be acutely inflamed in the pelvic position with inflammatory adhesions to the omentum, distal small bowel, right fallopian tube.  The appendix was carefully dissected. The base of the appendix was dissected out and divided with a standard load Endo GIA. The mesoappendix was divided the use of a harmonic scalpel.  The appendix was passed out through the left lateral port site with the aid of an  Endo Catch bag. The right lower quadrant and pelvis was then irrigated with copious amounts of normal saline which was aspirated. Inspection  failed to identify any additional bleeding and there were no signs of bowel injury.  At this point the pneumoperitoneum was released and all of the ports were removed.  The right lower quadrant fascia was closed with a figure-of-eight 3-0 Vicryl and all skin incisions were approximated with subcuticular 4-0 Monocryl. Steri-Strips and Mastisol and sterile dressings were placed.  The patient tolerated the procedure well, there were no complications. The sponge lap and needle count were correct at the end of the procedure.  The patient was taken to the recovery room in stable condition to be admitted for continued care.  Findings: Acute appendicitis  Estimated Blood Loss: 10 mL                  Specimens: appendix         Complications: None                  Julie Frameharles Caileigh Canche MD, FACS

## 2017-12-30 NOTE — ED Notes (Signed)
ED TO INPATIENT HANDOFF REPORT  Name/Age/Gender Julie Odom 27 y.o. female  Code Status Code Status History    This patient does not have a recorded code status. Please follow your organizational policy for patients in this situation.      Home/SNF/Other Home  Chief Complaint abd pain  Level of Care/Admitting Diagnosis ED Disposition    ED Disposition Condition [facility]: [place] [100120]  Level of Care: Med-Surg [16]  Diagnosis: Abdominal pain [144818]  Admitting Physician: Clayburn Pert [[id number]]  Attending Physician: Clayburn Pert [phone]  PT Class (Do Not Modify): Observation [104]  PT Acc Code (Do Not Modify): Observation [10022]       Medical History History reviewed. No pertinent past medical history.  Allergies Allergies  Allergen Reactions  . Penicillins     IV Location/Drains/Wounds Patient Lines/Drains/Airways Status   Active Line/Drains/Airways    Name:   Placement date:   Placement time:   Site:   Days:   Peripheral IV 12/30/17 Right Antecubital   12/30/17    0225    Antecubital   less than 1          Labs/Imaging Results for orders placed or performed during the hospital encounter of 12/30/17 (from the past 48 hour(s))  Lipase, blood     Status: None   Collection Time: 12/29/17  7:52 PM  Result Value Ref Range   Lipase 24 11 - 51 U/L    Comment: Performed at [facility], [address] [place], [place] [address]  Comprehensive metabolic panel     Status: Abnormal   Collection Time: 12/29/17  7:52 PM  Result Value Ref Range   Sodium 136 135 - 145 mmol/L   Potassium 3.4 (L) 3.5 - 5.1 mmol/L   Chloride 106 101 - 111 mmol/L   CO2 22 22 - 32 mmol/L   Glucose, Bld 126 (H) 65 - 99 mg/dL   BUN 12 6 - 20 mg/dL   Creatinine, Ser 0.98 0.44 - 1.00 mg/dL   Calcium 8.6 (L) 8.9 - 10.3 mg/dL   Total Protein 6.7 6.5 - 8.1 g/dL   Albumin 4.0 3.5 - 5.0 g/dL   AST 22 15 - 41 U/L   ALT  18 14 - 54 U/L   Alkaline Phosphatase 48 38 - 126 U/L   Total Bilirubin 0.8 0.3 - 1.2 mg/dL   GFR calc non Af Amer >60 >60 mL/min   GFR calc Af Amer >60 >60 mL/min    Comment: (NOTE) The eGFR has been calculated using the CKD EPI equation. This calculation has not been validated in all clinical situations. eGFR's persistently <60 mL/min signify possible Chronic Kidney Disease.    Anion gap 8 5 - 15    Comment: Performed at [facility], [address] [place], [place] [address]  CBC     Status: Abnormal   Collection Time: 12/29/17  7:52 PM  Result Value Ref Range   WBC 12.9 (H) 3.6 - 11.0 K/uL   RBC 3.97 3.80 - 5.20 MIL/uL   Hemoglobin 12.5 12.0 - 16.0 g/dL   HCT 36.9 35.0 - 47.0 %   MCV 92.8 80.0 - 100.0 fL   MCH 31.5 26.0 - 34.0 pg   MCHC 33.9 32.0 - 36.0 g/dL   RDW 12.4 11.5 - 14.5 %   Platelets 251 150 - 440 K/uL    Comment: Performed at [facility], [address], [place], [place] [address]  Urinalysis, Complete w Microscopic     Status: Abnormal   Collection Time: 12/29/17  7:52 PM  Result Value Ref Range   Color, Urine YELLOW (A) YELLOW   APPearance CLEAR (A) CLEAR   Specific Gravity, Urine 1.016 1.005 - 1.030   pH 7.0 5.0 - 8.0   Glucose, UA NEGATIVE NEGATIVE mg/dL   Hgb urine dipstick NEGATIVE NEGATIVE   Bilirubin Urine NEGATIVE NEGATIVE   Ketones, ur 5 (A) NEGATIVE mg/dL   Protein, ur NEGATIVE NEGATIVE mg/dL   Nitrite NEGATIVE NEGATIVE   Leukocytes, UA NEGATIVE NEGATIVE   RBC / HPF 0-5 0 - 5 RBC/hpf   WBC, UA 0-5 0 - 5 WBC/hpf   Bacteria, UA NONE SEEN NONE SEEN   Squamous Epithelial / LPF 0-5 (A) NONE SEEN   Mucus PRESENT     Comment: Performed at [facility], [address] [place], [place] [address]  Pregnancy, urine POC     Status: None   Collection Time: 12/29/17  8:06 PM  Result Value Ref Range   Preg Test, Ur NEGATIVE NEGATIVE    Comment:        THE SENSITIVITY OF THIS METHODOLOGY IS >24 mIU/mL    Ct Abdomen  Pelvis W Contrast  Result Date: 12/30/2017 CLINICAL DATA:  Right lower quadrant abdominal pain. EXAM: CT ABDOMEN AND PELVIS WITH CONTRAST TECHNIQUE: Multidetector CT imaging of the abdomen and pelvis was performed using the standard protocol following bolus administration of intravenous contrast. CONTRAST:  136m ISOVUE-300 IOPAMIDOL (ISOVUE-300) INJECTION 61% COMPARISON:  None. FINDINGS: Lower chest: No acute abnormality. Hepatobiliary: No focal liver abnormality is seen. No gallstones, gallbladder wall thickening, or biliary dilatation. Pancreas: Unremarkable. No pancreatic ductal dilatation or surrounding inflammatory changes. Spleen: Normal in size without focal abnormality. Adrenals/Urinary Tract: Adrenal glands are unremarkable. Kidneys are normal, without renal calculi, focal lesion, or hydronephrosis. Bladder is unremarkable. Stomach/Bowel: The stomach is unremarkable. There is no evidence of bowel obstruction. The appendix is enlarged and fluid filled with probable appendicolith, concerning for appendicitis. Appendix: Location: Right lower quadrant. Diameter: 15 mm. Appendicolith: Yes. Mucosal hyper-enhancement: Yes. Extraluminal gas: No. Periappendiceal collection: No. Vascular/Lymphatic: No significant vascular findings are present. No enlarged abdominal or pelvic lymph nodes. Reproductive: Uterus and bilateral adnexa are unremarkable. Other: Mild amount of free fluid is noted posteriorly in the pelvis which may be physiologic or potentially related to possible appendicitis. Musculoskeletal: No acute or significant osseous findings. IMPRESSION: Findings consistent with acute appendicitis. No definite abscess formation is seen. Electronically Signed   By: JMarijo Conception M.D.   On: 12/30/2017 02:53    Pending Labs Unresulted Labs (From admission, onward)   Start     Ordered   12/30/17 0309  [place] and screen [place],   STAT    Comments:  [place]   12/30/17 0308   Signed and Held  HIV antibody (Routine Testing)  Once,   R     Signed and Held   Signed and Held  Basic metabolic panel  Tomorrow morning,   R     Signed and Held   Signed and Held  CBC  Tomorrow morning,   R     Signed and Held      Vitals/Pain Today's Vitals   12/29/17 1950 12/30/17 0157 12/30/17 0250 12/30/17 0301  BP:  131/79  125/81  Pulse:  (!) 115  (!) 107  Resp:  16    Temp:  99 F (  37.2 C)    TempSrc:  Oral    SpO2:  98%  100%  Weight: 135 lb (61.2 kg)     Height: _0  (1.626 m)     PainSc: 7   6      Isolation Precautions No active isolations  Medications Medications  cefTRIAXone (ROCEPHIN) 2 g in dextrose 5 % 50 mL IVPB (not administered)    And  metroNIDAZOLE (FLAGYL) IVPB 500 mg (500 mg Intravenous New Bag/Given 12/30/17 0324)  sodium chloride 0.9 % bolus 1,000 mL (0 mLs Intravenous Stopped 12/30/17 0343)  ondansetron (ZOFRAN) injection 4 mg (4 mg Intravenous Given 12/30/17 0213)  morphine 4 MG/ML injection 4 mg (4 mg Intravenous Given 12/30/17 0213)  iopamidol (ISOVUE-300) 61 % injection 100 mL (100 mLs Intravenous Contrast Given 12/30/17 0227)  sodium chloride 0.9 % bolus 1,000 mL (1,000 mLs Intravenous New Bag/Given 12/30/17 0343)    Mobility walks

## 2017-12-30 NOTE — Anesthesia Postprocedure Evaluation (Signed)
Anesthesia Post Note  Patient: Ernestine McmurrayMary Alvis  Procedure(s) Performed: APPENDECTOMY LAPAROSCOPIC (N/A )  Patient location during evaluation: PACU Anesthesia Type: General Level of consciousness: awake and alert Pain management: pain level controlled Vital Signs Assessment: post-procedure vital signs reviewed and stable Respiratory status: spontaneous breathing, nonlabored ventilation, respiratory function stable and patient connected to nasal cannula oxygen Cardiovascular status: blood pressure returned to baseline and stable Postop Assessment: no apparent nausea or vomiting Anesthetic complications: no     Last Vitals:  Vitals:   12/30/17 1716 12/30/17 2024  BP: 116/68 (!) 102/56  Pulse: 92 89  Resp: 16 16  Temp: 36.8 C 36.7 C  SpO2: 99% 99%    Last Pain:  Vitals:   12/30/17 2024  TempSrc: Oral  PainSc:                  Lenard SimmerAndrew Andri Prestia

## 2017-12-31 ENCOUNTER — Encounter: Payer: Self-pay | Admitting: General Surgery

## 2017-12-31 ENCOUNTER — Other Ambulatory Visit: Payer: Self-pay

## 2017-12-31 MED ORDER — HYDROCODONE-ACETAMINOPHEN 5-325 MG PO TABS: 1.0000 | ORAL_TABLET | Freq: Four times a day (QID) | ORAL | 0 refills | Status: DC | PRN

## 2017-12-31 MED ORDER — METRONIDAZOLE 500 MG PO TABS: 500.0000 mg | ORAL_TABLET | Freq: Three times a day (TID) | ORAL | 1 refills | Status: DC

## 2017-12-31 MED ORDER — CIPROFLOXACIN HCL 500 MG PO TABS: 500.0000 mg | ORAL_TABLET | Freq: Two times a day (BID) | ORAL | 1 refills | Status: DC

## 2017-12-31 NOTE — Progress Notes (Signed)
Julie McmurrayMary Odom to be D/C'd Home per MD order.  Discussed prescriptions and follow up appointments with the Odom. Prescriptions given to Odom, medication list explained in detail. Pt verbalized understanding.  Allergies as of 12/31/2017      Reactions   Penicillins       Medication List    TAKE these medications   ciprofloxacin 500 MG tablet Commonly known as:  CIPRO Take 1 tablet (500 mg total) by mouth 2 (two) times daily.   HYDROcodone-acetaminophen 5-325 MG tablet Commonly known as:  NORCO/VICODIN Take 1-2 tablets by mouth every 6 (six) hours as needed for moderate pain or severe pain.   metroNIDAZOLE 500 MG tablet Commonly known as:  FLAGYL Take 1 tablet (500 mg total) by mouth 3 (three) times daily.   TRI-LINYAH 0.18/0.215/0.25 MG-35 MCG tablet Generic drug:  Norgestimate-Ethinyl Estradiol Triphasic Take 1 tablet by mouth daily.       Vitals:   12/30/17 2024 12/31/17 0459  BP: (!) 102/56 107/61  Pulse: 89 64  Resp: 16 18  Temp: 98 F (36.7 C) 97.8 F (36.6 C)  SpO2: 99% 100%    Skin clean, dry and intact without evidence of skin break down, no evidence of skin tears noted. IV catheter discontinued intact. Site without signs and symptoms of complications. Dressing and pressure applied. Pt denies pain at this time. No complaints noted.  An After Visit Summary was printed and given to the Odom.  Julie PatientJessica K Cybill Odom

## 2017-12-31 NOTE — Discharge Summary (Signed)
Physician Discharge Summary  Patient ID: Julie McmurrayMary Odom MRN: [id number] DOB/AGE: 27/20/1992 26 y.o.  Admit date: 12/30/2017 Discharge date: 12/31/2017   Discharge Diagnoses:  Active Problems:   Abdominal pain   Acute appendicitis   Procedures: Laparoscopic appendectomy  Hospital Course: This patient who presented to the hospital with signs of acute appendicitis.  She was taken the operating room where laparoscopic appendectomy was performed.  She had signs of severe acute appendicitis.  She was continued on IV antibiotics and is transition to oral antibiotics.  She will be discharged on oral Cipro and Flagyl and oral analgesics to follow-up with Dr. Armond HangWoodrum in 10 days.  She is instructed to remove dressings this afternoon and shower this afternoon.  She is on a regular diet.  Consults: None  Disposition: Final discharge disposition not confirmed   Allergies as of 12/31/2017      Reactions   Penicillins       Medication List    TAKE these medications   ciprofloxacin 500 MG tablet Commonly known as:  CIPRO Take 1 tablet (500 mg total) by mouth 2 (two) times daily.   HYDROcodone-acetaminophen 5-325 MG tablet Commonly known as:  NORCO/VICODIN Take 1-2 tablets by mouth every 6 (six) hours as needed for moderate pain or severe pain.   metroNIDAZOLE 500 MG tablet Commonly known as:  FLAGYL Take 1 tablet (500 mg total) by mouth 3 (three) times daily.   TRI-LINYAH 0.18/0.215/0.25 MG-35 MCG tablet Generic drug:  Norgestimate-Ethinyl Estradiol Triphasic Take 1 tablet by mouth daily.        Lattie Hawichard E Jule Schlabach, MD, FACS

## 2017-12-31 NOTE — Discharge Instructions (Signed)
Remove dressings this afternoon May shower this afternoon Resume home medications Start taking oral antibiotics and oral analgesics today. Follow-up with Dr. Tonita CongWoodham in 10 days

## 2017-12-31 NOTE — Progress Notes (Signed)
1 Day Post-Op  Subjective: Status post laparoscopic appendectomy for severe acute appendicitis.  Patient feels well this morning and is tolerating a diet and is ready for discharge.  Objective: Vital signs in last 24 hours: Temp:  [97.8 F (36.6 C)-99.4 F (37.4 C)] 97.8 F (36.6 C) (02/03 0459) Pulse Rate:  [64-114] 64 (02/03 0459) Resp:  [14-18] 18 (02/03 0459) BP: (101-125)/(56-77) 107/61 (02/03 0459) SpO2:  [95 %-100 %] 100 % (02/03 0459) Last BM Date: 12/29/17  Intake/Output from previous day: 02/02 0701 - 02/03 0700 In: 3555.8 [P.O.:237; I.V.:2918.8; IV Piggyback:400] Out: 60 [Urine:50; Blood:10] Intake/Output this shift: No intake/output data recorded.  Physical exam:  Wounds are clean no erythema or drainage abdomen is soft nondistended nontympanitic and nontender  Lab Results: CBC  Recent Labs    12/29/17 1952 12/30/17 0546  WBC 12.9* 12.3*  HGB 12.5 11.8*  HCT 36.9 34.8*  PLT 251 190   BMET Recent Labs    12/29/17 1952 12/30/17 0546  NA 136 137  K 3.4* 3.6  CL 106 106  CO2 22 21*  GLUCOSE 126* 167*  BUN 12 8  CREATININE 0.98 0.72  CALCIUM 8.6* 8.1*   PT/INR No results for input(s): LABPROT, INR in the last 72 hours. ABG No results for input(s): PHART, HCO3 in the last 72 hours.  Invalid input(s): PCO2, PO2  Studies/Results: Ct Abdomen Pelvis W Contrast  Result Date: 12/30/2017 CLINICAL DATA:  Right lower quadrant abdominal pain. EXAM: CT ABDOMEN AND PELVIS WITH CONTRAST TECHNIQUE: Multidetector CT imaging of the abdomen and pelvis was performed using the standard protocol following bolus administration of intravenous contrast. CONTRAST:  ISOVUE-300 IOPAMIDOL (ISOVUE-300) INJECTION 61% COMPARISON:  None. FINDINGS: Lower chest: No acute abnormality. Hepatobiliary: No focal liver abnormality is seen. No gallstones, gallbladder wall thickening, or biliary dilatation. Pancreas: Unremarkable. No pancreatic ductal dilatation or surrounding  inflammatory changes. Spleen: Normal in size without focal abnormality. Adrenals/Urinary Tract: Adrenal glands are unremarkable. Kidneys are normal, without renal calculi, focal lesion, or hydronephrosis. Bladder is unremarkable. Stomach/Bowel: The stomach is unremarkable. There is no evidence of bowel obstruction. The appendix is enlarged and fluid filled with probable appendicolith, concerning for appendicitis. Appendix: Location: Right lower quadrant. Diameter: 15 mm. Appendicolith: Yes. Mucosal hyper-enhancement: Yes. Extraluminal gas: No. Periappendiceal collection: No. Vascular/Lymphatic: No significant vascular findings are present. No enlarged abdominal or pelvic lymph nodes. Reproductive: Uterus and bilateral adnexa are unremarkable. Other: Mild amount of free fluid is noted posteriorly in the pelvis which may be physiologic or potentially related to possible appendicitis. Musculoskeletal: No acute or significant osseous findings. IMPRESSION: Findings consistent with acute appendicitis. No definite abscess formation is seen. Electronically Signed   By: Lupita Raider, M.D.   On: 12/30/2017 02:53    Anti-infectives: Anti-infectives (From admission, onward)   Start     Dose/Rate Route Frequency Ordered Stop   12/30/17 1130  metroNIDAZOLE (FLAGYL) IVPB 500 mg     500 mg 100 mL/hr over 60 Minutes Intravenous Every 8 hours 12/30/17 0431     12/30/17 0431  ciprofloxacin (CIPRO) IVPB 400 mg     400 mg 200 mL/hr over 60 Minutes Intravenous Every 12 hours 12/30/17 0431     12/30/17 0315  cefTRIAXone (ROCEPHIN) 2 g in dextrose 5 % 50 mL IVPB     2 g 100 mL/hr over 30 Minutes Intravenous  Once 12/30/17 0308 12/30/17 0818   12/30/17 0315  metroNIDAZOLE (FLAGYL) IVPB 500 mg     500 mg 100 mL/hr  over 60 Minutes Intravenous  Once 12/30/17 0308 12/30/17 0424      Assessment/Plan: s/p Procedure(s): APPENDECTOMY LAPAROSCOPIC   Patient doing very well.  Will discharge today on oral antibiotics to  follow-up with Dr. Armond HangWoodrum in 10 days.  She may shower  Lattie Hawichard E Brion Hedges, MD, Carilyn GoodpastureFACS  12/31/2017

## 2018-01-01 ENCOUNTER — Telehealth: Payer: Self-pay | Admitting: General Surgery

## 2018-01-01 LAB — HIV ANTIBODY (ROUTINE TESTING W REFLEX): HIV Screen 4th Generation wRfx: NONREACTIVE

## 2018-01-01 NOTE — Telephone Encounter (Signed)
Returned call at this time and advise patient that her restrictions would be no heavy lifting greater than 10 pounds, avoid strenuous activity as much as possible, no submerging in bathtubs, pool, and or hot tubs, be cautious of what she eats, and that if she does not have a bowel movement within the next 24 hours to try some over the counter Miralax. She verbalized understanding and wanted to know if she could move her post op appointment to be seen sooner due to wanting to return to clinic classes. I offered her an appointment to be seen on 2/13 with Dr. Aleen CampiPiscoya she verbalized understanding and accpected the appointment. I advised for her to call back with any questions or concerns she verbalized understanding.

## 2018-01-01 NOTE — Telephone Encounter (Signed)
Julie Odom called said Dr. Tonita CongWoodham went over her restrictions, but the Julie Odom can't remember what all they were and is asking if someone could give her a call reminding her of what she needs to do. Please call and advise.

## 2018-01-02 LAB — SURGICAL PATHOLOGY

## 2018-01-10 ENCOUNTER — Encounter: Payer: Self-pay | Admitting: Surgery

## 2018-01-10 ENCOUNTER — Ambulatory Visit (INDEPENDENT_AMBULATORY_CARE_PROVIDER_SITE_OTHER): Payer: Self-pay | Admitting: Surgery

## 2018-01-10 VITALS — BP 123/85 | HR 82 | Ht 64.0 in | Wt 131.8 lb

## 2018-01-10 DIAGNOSIS — Z09 Encounter for follow-up examination after completed treatment for conditions other than malignant neoplasm: Secondary | ICD-10-CM

## 2018-01-10 NOTE — Progress Notes (Signed)
01/10/2018  HPI: Patient is s/p laparoscopic appendectomy with Dr. Tonita CongWoodham on 12/30/17.  She presents today for follow up.  She reports no further pain and no issues with the incisions.  Having good appetite and normal bowel movements.  Denies any fevers or chills.  Vital signs: BP 123/85   Pulse 82   Ht 5\' 4"  (1.626 m)   Wt 59.8 kg (131 lb 12.8 oz)   LMP 12/22/2017 Comment: neg. preg test  BMI 22.62 kg/m    Physical Exam: Constitutional: No acute distress Abdomen:  Soft, nondistended, nontender to palpation.  Incisions are clean, dry, intact, and healing well, with no evidence of infection.  The left lower quadrant incision has what looks like surgical suture knot that is palpable, but still under the skin.  Assessment/Plan: 27 yo female s/p laparoscopic appendectomy  --discussed with patient no heavy lifting or pushing restriction until 01/27/18.  She may resume normal activities at that point. --reviewed pathology with patient.  Acute appendicitis without evidence of malignancy. --patient may follow up with us on an as needed basis.   Howie IllJose Luis Salvadore Valvano, MD [facility]

## 2018-01-10 NOTE — Patient Instructions (Signed)
GENERAL POST-OPERATIVE PATIENT INSTRUCTIONS   WOUND CARE INSTRUCTIONS:  Keep a dry clean dressing on the wound if there is drainage. The initial bandage may be removed after 24 hours.  Once the wound has quit draining you may leave it open to air.  If clothing rubs against the wound or causes irritation and the wound is not draining you may cover it with a dry dressing during the daytime.  Try to keep the wound dry and avoid ointments on the wound unless directed to do so.  If the wound becomes bright red and painful or starts to drain infected material that is not clear, please contact your physician immediately.  If the wound is mildly pink and has a thick firm ridge underneath it, this is normal, and is referred to as a healing ridge.  This will resolve over the next 4-6 weeks.  BATHING: You may shower if you have been informed of this by your surgeon. However, Please do not submerge in a tub, hot tub, or pool until incisions are completely sealed or have been told by your surgeon that you may do so.  DIET:  You may eat any foods that you can tolerate.  It is a good idea to eat a high fiber diet and take in plenty of fluids to prevent constipation.  If you do become constipated you may want to take a mild laxative or take ducolax tablets on a daily basis until your bowel habits are regular.  Constipation can be very uncomfortable, along with straining, after recent surgery.  ACTIVITY:  You are encouraged to cough and deep breath or use your incentive spirometer if you were given one, every 15-30 minutes when awake.  This will help prevent respiratory complications and low grade fevers post-operatively if you had a general anesthetic.  You may want to hug a pillow when coughing and sneezing to add additional support to the surgical area, if you had abdominal or chest surgery, which will decrease pain during these times.  You are encouraged to walk and engage in light activity for the next two weeks.  You  should not lift more than 20 pounds, until 01/27/2018 as it could put you at increased risk for complications.  Twenty pounds is roughly equivalent to a plastic bag of groceries. At that time- Listen to your body when lifting, if you have pain when lifting, stop and then try again in a few days. Soreness after doing exercises or activities of daily living is normal as you get back in to your normal routine.  MEDICATIONS:  Try to take narcotic medications and anti-inflammatory medications, such as tylenol, ibuprofen, naprosyn, etc., with food.  This will minimize stomach upset from the medication.  Should you develop nausea and vomiting from the pain medication, or develop a rash, please discontinue the medication and contact your physician.  You should not drive, make important decisions, or operate machinery when taking narcotic pain medication.  SUNBLOCK Use sun block to incision area over the next year if this area will be exposed to sun. This helps decrease scarring and will allow you avoid a permanent darkened area over your incision.  QUESTIONS:  Please feel free to call our office if you have any questions, and we will be glad to assist you. [phone]    

## 2018-01-17 ENCOUNTER — Encounter: Payer: Self-pay | Admitting: General Surgery

## 2019-04-27 IMAGING — CT CT ABD-PELV W/ CM
2 of 4 series · 15 of 46 positions shown, 17 images · IV contrast (APPLIED)
Comparison: None.

CLINICAL DATA: Right lower quadrant abdominal pain.

EXAM:
CT ABDOMEN AND PELVIS WITH CONTRAST
TECHNIQUE: Multidetector CT imaging of the abdomen and pelvis was performed
using the standard protocol following bolus administration of
intravenous contrast.
CONTRAST:  100mL LYOY57-588 IOPAMIDOL (LYOY57-588) INJECTION 61%

[Series 2: routine abd/pel with · axial · 0.60mm/px · z∈[-1122,-758]mm · 12 of 87 slices shown, 14 images]
[im 7/87  soft-tissue]
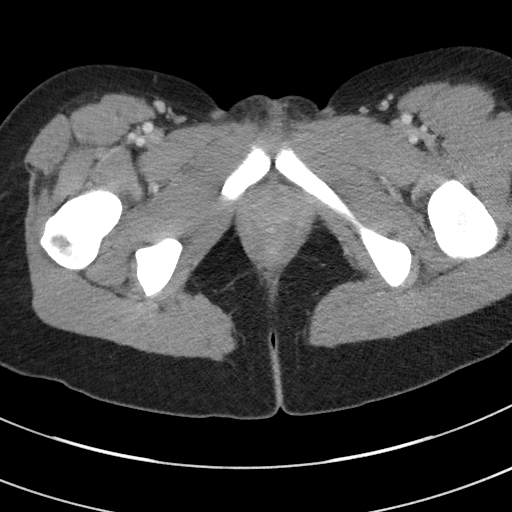
[im 7/87  bone]
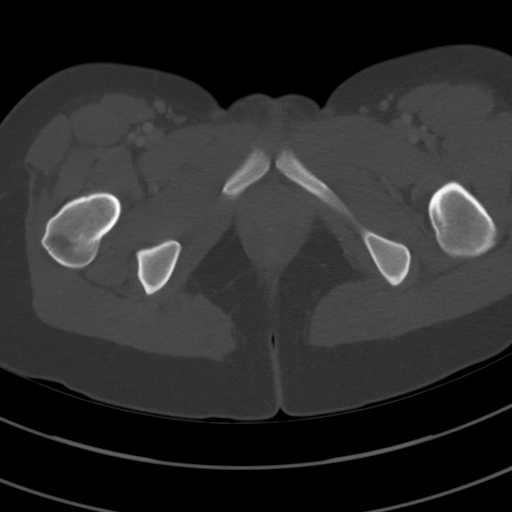
[im 14/87  soft-tissue]
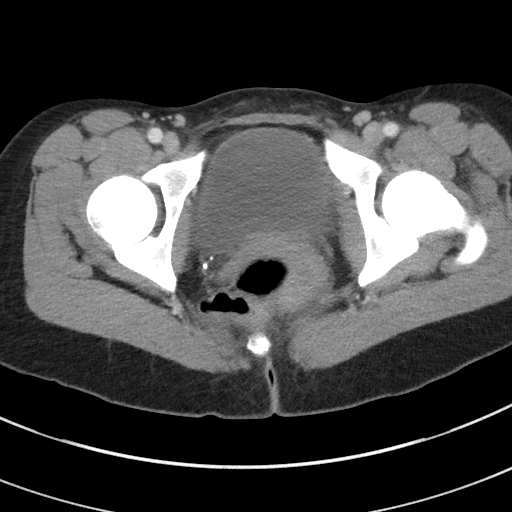
[im 20/87  soft-tissue]
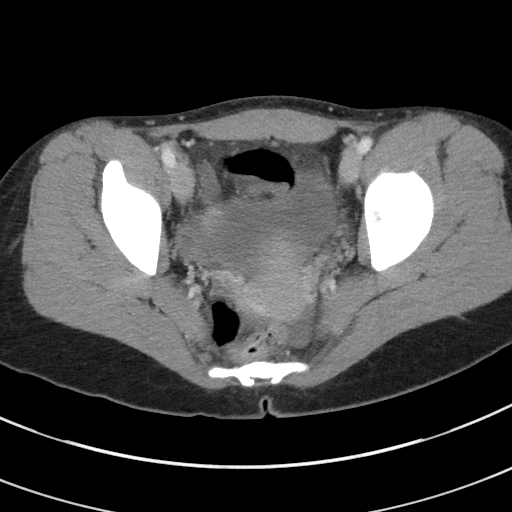
[im 27/87  soft-tissue]
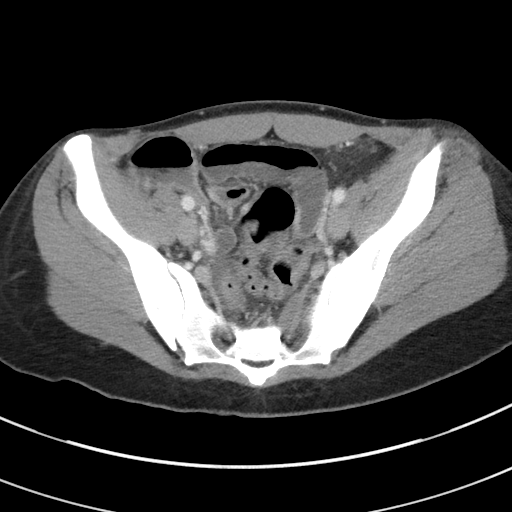
[im 34/87  soft-tissue]
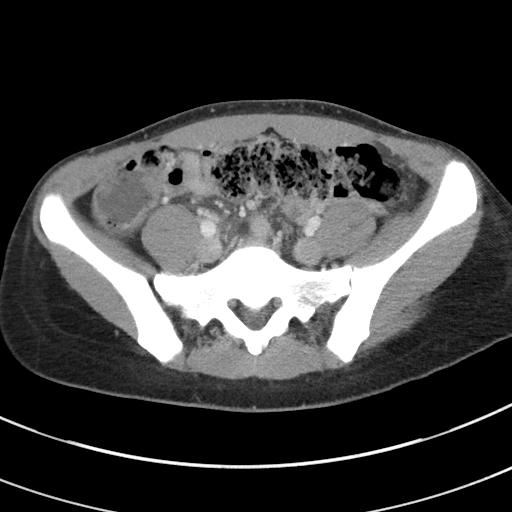
[im 40/87  soft-tissue]
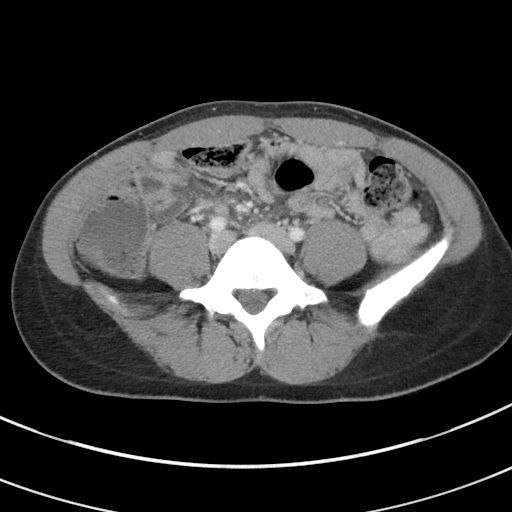
[im 47/87  soft-tissue]
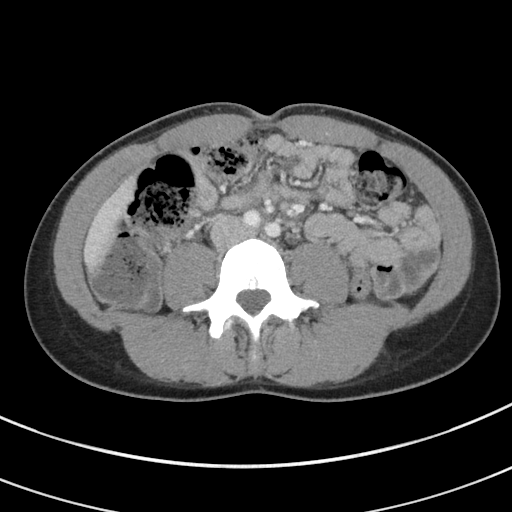
[im 53/87  soft-tissue]
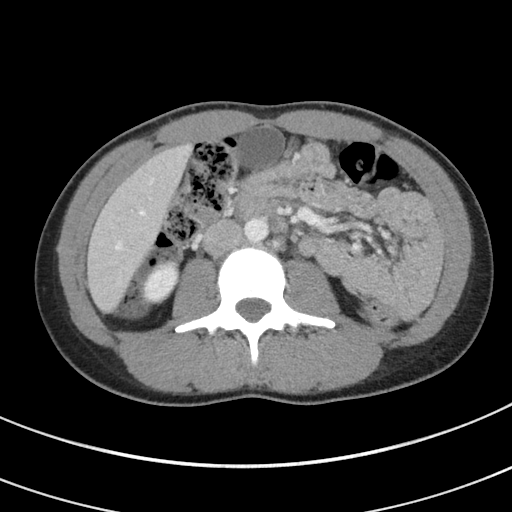
[im 60/87  soft-tissue]
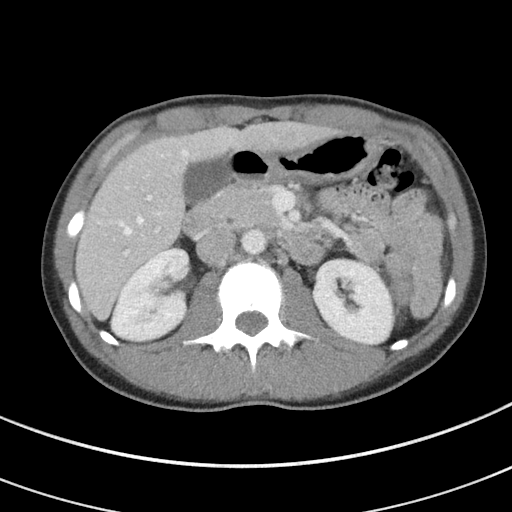
[im 60/87  bone]
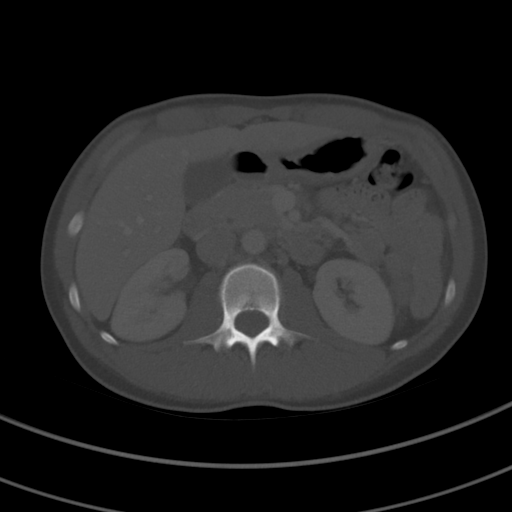
[im 67/87  soft-tissue]
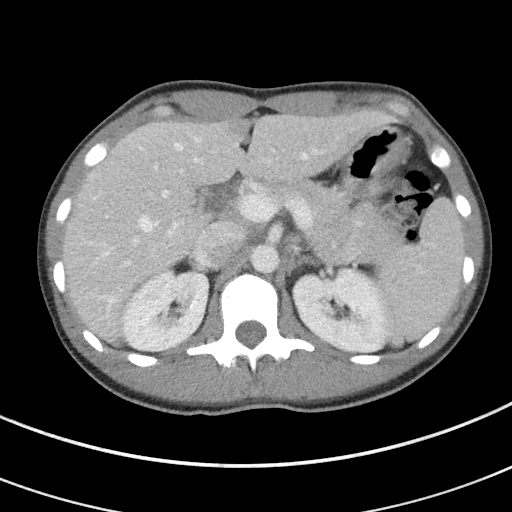
[im 73/87  soft-tissue]
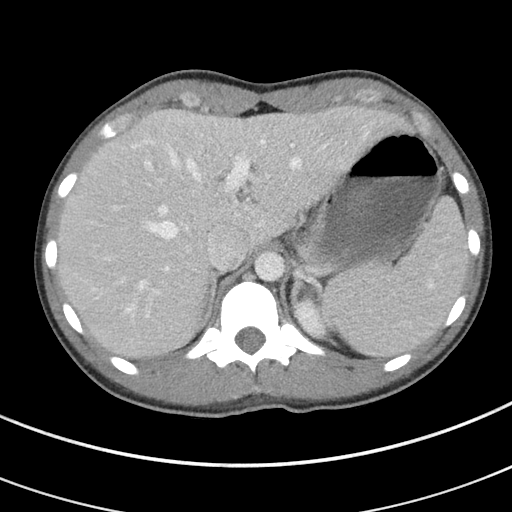
[im 80/87  soft-tissue]
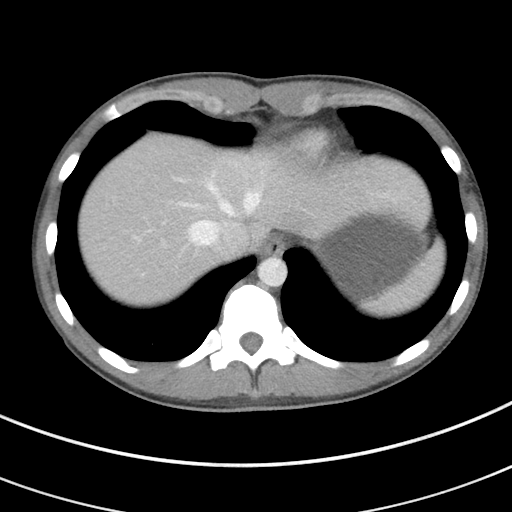

[Series 5: coronal st · coronal · 0.60mm/px · 3 of 64 slices shown]
[im 22/64  soft-tissue]
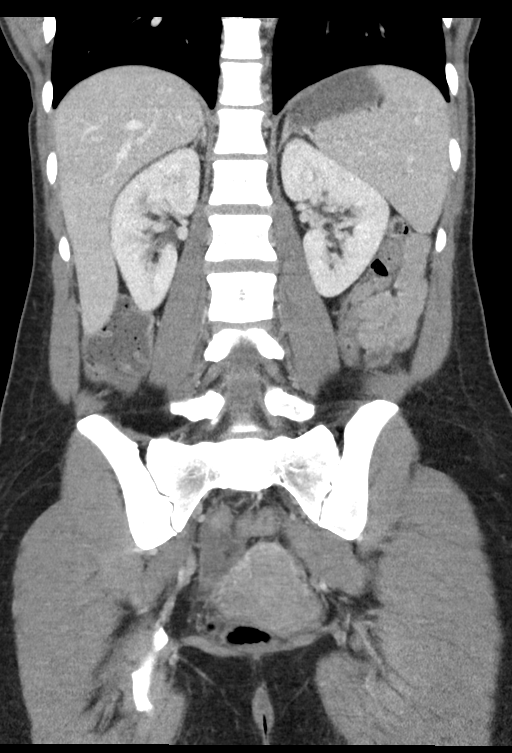
[im 29/64  soft-tissue]
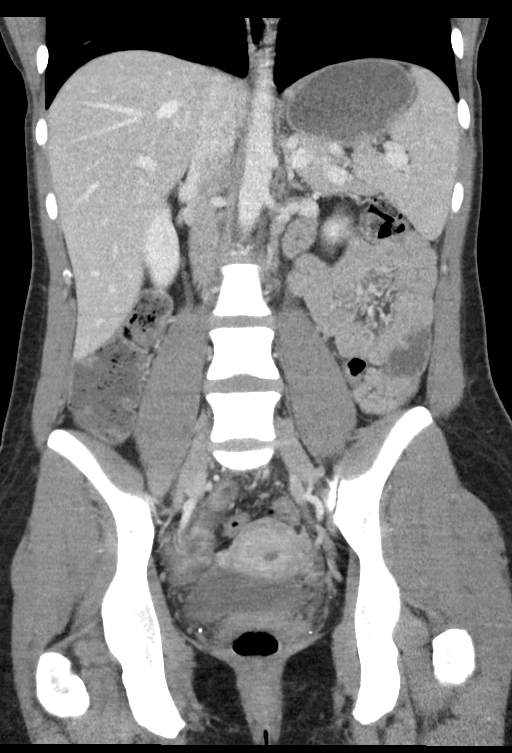
[im 36/64  soft-tissue]
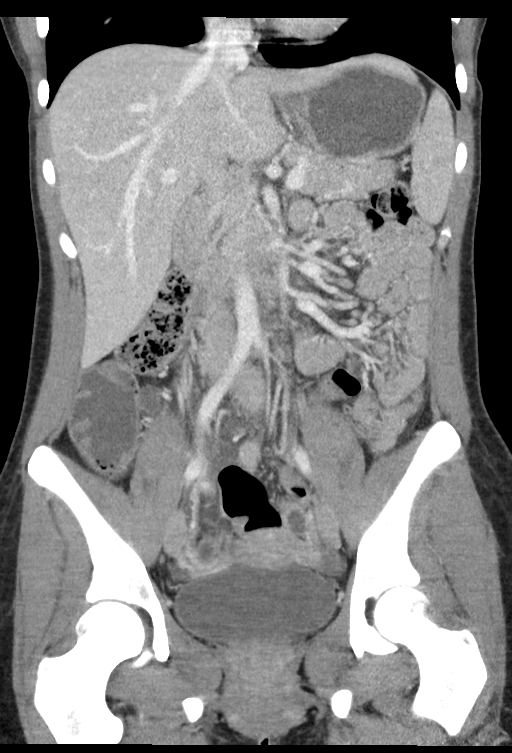

[15 of 46 positions shown; findings below may reference images not displayed]

FINDINGS: Lower chest: No acute abnormality.

Hepatobiliary: No focal liver abnormality is seen. No gallstones,
gallbladder wall thickening, or biliary dilatation.

Pancreas: Unremarkable. No pancreatic ductal dilatation or
surrounding inflammatory changes.

Spleen: Normal in size without focal abnormality.

Adrenals/Urinary Tract: Adrenal glands are unremarkable. Kidneys are
normal, without renal calculi, focal lesion, or hydronephrosis.
Bladder is unremarkable.

Stomach/Bowel: The stomach is unremarkable. There is no evidence of
bowel obstruction. The appendix is enlarged and fluid filled with
probable appendicolith, concerning for appendicitis.

Appendix: Location: Right lower quadrant.

Diameter: 15 mm.

Appendicolith: Yes.

Mucosal hyper-enhancement: Yes.

Extraluminal gas: No.

Periappendiceal collection: No.

Vascular/Lymphatic: No significant vascular findings are present. No
enlarged abdominal or pelvic lymph nodes.

Reproductive: Uterus and bilateral adnexa are unremarkable.

Other: Mild amount of free fluid is noted posteriorly in the pelvis
which may be physiologic or potentially related to possible
appendicitis.

Musculoskeletal: No acute or significant osseous findings.
IMPRESSION: Findings consistent with acute appendicitis. No definite abscess
formation is seen.
